# Patient Record
Sex: Male | Born: 1965 | Race: White | Hispanic: No | Marital: Single | Smoking: Former smoker
Health system: Southern US, Community
[De-identification: ages and names within clinical notes are randomized; demographics above are authoritative.]

## PROBLEM LIST (undated history)

## (undated) DIAGNOSIS — J449 Chronic obstructive pulmonary disease, unspecified: Secondary | ICD-10-CM

## (undated) DIAGNOSIS — B1920 Unspecified viral hepatitis C without hepatic coma: Secondary | ICD-10-CM

## (undated) HISTORY — PX: FINGER SURGERY: SHX640

---

## 2018-05-13 ENCOUNTER — Emergency Department (HOSPITAL_COMMUNITY)

## 2018-05-13 ENCOUNTER — Encounter (HOSPITAL_COMMUNITY): Payer: Self-pay

## 2018-05-13 ENCOUNTER — Inpatient Hospital Stay (HOSPITAL_COMMUNITY)
Admission: EM | Admit: 2018-05-13 | Discharge: 2018-05-15 | DRG: 192 | Disposition: A | Discharge status: Home / Self Care | Attending: Family Medicine | Admitting: Family Medicine

## 2018-05-13 ENCOUNTER — Other Ambulatory Visit: Payer: Self-pay

## 2018-05-13 DIAGNOSIS — B182 Chronic viral hepatitis C: Secondary | ICD-10-CM | POA: Diagnosis present

## 2018-05-13 DIAGNOSIS — Z87891 Personal history of nicotine dependence: Secondary | ICD-10-CM

## 2018-05-13 DIAGNOSIS — R740 Nonspecific elevation of levels of transaminase and lactic acid dehydrogenase [LDH]: Secondary | ICD-10-CM

## 2018-05-13 DIAGNOSIS — Z79899 Other long term (current) drug therapy: Secondary | ICD-10-CM

## 2018-05-13 DIAGNOSIS — J449 Chronic obstructive pulmonary disease, unspecified: Secondary | ICD-10-CM | POA: Diagnosis present

## 2018-05-13 DIAGNOSIS — R059 Cough, unspecified: Secondary | ICD-10-CM | POA: Diagnosis present

## 2018-05-13 DIAGNOSIS — J441 Chronic obstructive pulmonary disease with (acute) exacerbation: Secondary | ICD-10-CM | POA: Diagnosis not present

## 2018-05-13 DIAGNOSIS — R0603 Acute respiratory distress: Secondary | ICD-10-CM | POA: Diagnosis not present

## 2018-05-13 DIAGNOSIS — R05 Cough: Secondary | ICD-10-CM | POA: Diagnosis present

## 2018-05-13 DIAGNOSIS — J069 Acute upper respiratory infection, unspecified: Secondary | ICD-10-CM | POA: Diagnosis present

## 2018-05-13 DIAGNOSIS — B1920 Unspecified viral hepatitis C without hepatic coma: Secondary | ICD-10-CM | POA: Diagnosis present

## 2018-05-13 DIAGNOSIS — R7401 Elevation of levels of liver transaminase levels: Secondary | ICD-10-CM | POA: Diagnosis present

## 2018-05-13 DIAGNOSIS — D696 Thrombocytopenia, unspecified: Secondary | ICD-10-CM | POA: Diagnosis present

## 2018-05-13 DIAGNOSIS — Z23 Encounter for immunization: Secondary | ICD-10-CM

## 2018-05-13 DIAGNOSIS — R000 Tachycardia, unspecified: Secondary | ICD-10-CM | POA: Diagnosis present

## 2018-05-13 DIAGNOSIS — Z882 Allergy status to sulfonamides status: Secondary | ICD-10-CM

## 2018-05-13 HISTORY — DX: Unspecified viral hepatitis C without hepatic coma: B19.20

## 2018-05-13 HISTORY — DX: Chronic obstructive pulmonary disease, unspecified: J44.9

## 2018-05-13 LAB — COMPREHENSIVE METABOLIC PANEL
ALT: 254 U/L — AB (ref 0–44)
AST: 119 U/L — AB (ref 15–41)
Albumin: 3.8 g/dL (ref 3.5–5.0)
Alkaline Phosphatase: 64 U/L (ref 38–126)
Anion gap: 7 (ref 5–15)
BILIRUBIN TOTAL: 1 mg/dL (ref 0.3–1.2)
BUN: 9 mg/dL (ref 6–20)
CO2: 24 mmol/L (ref 22–32)
CREATININE: 0.69 mg/dL (ref 0.61–1.24)
Calcium: 8.7 mg/dL — ABNORMAL LOW (ref 8.9–10.3)
Chloride: 107 mmol/L (ref 98–111)
GFR calc Af Amer: >60 mL/min (ref >60)
Glucose, Bld: 121 mg/dL — ABNORMAL HIGH (ref 70–99)
Potassium: 3.5 mmol/L (ref 3.5–5.1)
Sodium: 138 mmol/L (ref 135–145)
TOTAL PROTEIN: 8.1 g/dL (ref 6.5–8.1)

## 2018-05-13 LAB — CBC WITH DIFFERENTIAL/PLATELET
Abs Immature Granulocytes: 0.01 10*3/uL (ref 0.00–0.07)
BASOS PCT: 1 %
Basophils Absolute: 0.1 10*3/uL (ref 0.0–0.1)
EOS ABS: 0.4 10*3/uL (ref 0.0–0.5)
Eosinophils Relative: 8 %
HCT: 45.6 % (ref 39.0–52.0)
Hemoglobin: 15.8 g/dL (ref 13.0–17.0)
Immature Granulocytes: 0 %
Lymphocytes Relative: 14 %
Lymphs Abs: 0.6 10*3/uL — ABNORMAL LOW (ref 0.7–4.0)
MCH: 32.8 pg (ref 26.0–34.0)
MCHC: 34.6 g/dL (ref 30.0–36.0)
MCV: 94.6 fL (ref 80.0–100.0)
MONO ABS: 0.2 10*3/uL (ref 0.1–1.0)
MONOS PCT: 5 %
NEUTROS PCT: 72 %
Neutro Abs: 3.4 10*3/uL (ref 1.7–7.7)
Platelets: 125 10*3/uL — ABNORMAL LOW (ref 150–400)
RBC: 4.82 MIL/uL (ref 4.22–5.81)
RDW: 12.4 % (ref 11.5–15.5)
WBC: 4.7 10*3/uL (ref 4.0–10.5)
nRBC: 0 % (ref 0.0–0.2)

## 2018-05-13 LAB — TROPONIN I: Troponin I: <0.03 ng/mL (ref <0.03)

## 2018-05-13 LAB — MRSA PCR SCREENING: MRSA by PCR: NEGATIVE

## 2018-05-13 MED ORDER — SODIUM CHLORIDE 0.9% FLUSH
3.0000 mL | Freq: Two times a day (BID) | INTRAVENOUS | Status: DC
  Administered 2018-05-13 – 2018-05-14 (×4): 3 mL via INTRAVENOUS

## 2018-05-13 MED ORDER — TRAZODONE HCL 50 MG PO TABS: 50.0000 mg | ORAL_TABLET | Freq: Every evening | ORAL | Status: DC | PRN

## 2018-05-13 MED ORDER — METHYLPREDNISOLONE SODIUM SUCC 125 MG IJ SOLR
125.0000 mg | Freq: Once | INTRAMUSCULAR | Status: DC
  Filled 2018-05-13: qty 2

## 2018-05-13 MED ORDER — ONDANSETRON HCL 4 MG PO TABS: 4.0000 mg | ORAL_TABLET | Freq: Four times a day (QID) | ORAL | Status: DC | PRN

## 2018-05-13 MED ORDER — GUAIFENESIN ER 600 MG PO TB12
1200.0000 mg | ORAL_TABLET | Freq: Two times a day (BID) | ORAL | Status: DC
  Administered 2018-05-13 – 2018-05-15 (×5): 1200 mg via ORAL
  Filled 2018-05-13 (×5): qty 2

## 2018-05-13 MED ORDER — ONDANSETRON HCL 4 MG/2ML IJ SOLN: 4.0000 mg | Freq: Four times a day (QID) | INTRAMUSCULAR | Status: DC | PRN

## 2018-05-13 MED ORDER — SODIUM CHLORIDE 0.9% FLUSH: 3.0000 mL | INTRAVENOUS | Status: DC | PRN

## 2018-05-13 MED ORDER — ENOXAPARIN SODIUM 40 MG/0.4ML ~~LOC~~ SOLN
40.0000 mg | SUBCUTANEOUS | Status: DC
  Administered 2018-05-13 – 2018-05-14 (×2): 40 mg via SUBCUTANEOUS
  Filled 2018-05-13 (×2): qty 0.4

## 2018-05-13 MED ORDER — ACETAMINOPHEN 650 MG RE SUPP: 650.0000 mg | Freq: Four times a day (QID) | RECTAL | Status: DC | PRN

## 2018-05-13 MED ORDER — SODIUM CHLORIDE 0.9 % IV SOLN
100.0000 mg | Freq: Two times a day (BID) | INTRAVENOUS | Status: DC
  Administered 2018-05-13 – 2018-05-15 (×4): 100 mg via INTRAVENOUS
  Filled 2018-05-13 (×8): qty 100

## 2018-05-13 MED ORDER — ACETAMINOPHEN 325 MG PO TABS: 650.0000 mg | ORAL_TABLET | Freq: Four times a day (QID) | ORAL | Status: DC | PRN

## 2018-05-13 MED ORDER — ALBUTEROL (5 MG/ML) CONTINUOUS INHALATION SOLN
10.0000 mg/h | INHALATION_SOLUTION | Freq: Once | RESPIRATORY_TRACT | Status: AC
  Administered 2018-05-13: 10 mg/h via RESPIRATORY_TRACT
  Filled 2018-05-13: qty 20

## 2018-05-13 MED ORDER — IPRATROPIUM-ALBUTEROL 0.5-2.5 (3) MG/3ML IN SOLN
3.0000 mL | RESPIRATORY_TRACT | Status: DC
  Administered 2018-05-13 – 2018-05-15 (×10): 3 mL via RESPIRATORY_TRACT
  Filled 2018-05-13 (×9): qty 3

## 2018-05-13 MED ORDER — SODIUM CHLORIDE 0.9 % IV SOLN: 250.0000 mL | INTRAVENOUS | Status: DC | PRN

## 2018-05-13 MED ORDER — METHYLPREDNISOLONE SODIUM SUCC 125 MG IJ SOLR
60.0000 mg | Freq: Four times a day (QID) | INTRAMUSCULAR | Status: DC
  Administered 2018-05-13 – 2018-05-14 (×4): 60 mg via INTRAVENOUS
  Filled 2018-05-13 (×4): qty 2

## 2018-05-13 MED ORDER — SENNOSIDES-DOCUSATE SODIUM 8.6-50 MG PO TABS: 1.0000 | ORAL_TABLET | Freq: Every evening | ORAL | Status: DC | PRN

## 2018-05-13 MED ORDER — IPRATROPIUM BROMIDE 0.02 % IN SOLN
1.0000 mg | Freq: Once | RESPIRATORY_TRACT | Status: AC
  Administered 2018-05-13: 1 mg via RESPIRATORY_TRACT
  Filled 2018-05-13: qty 5

## 2018-05-13 MED ORDER — DEXTROMETHORPHAN POLISTIREX ER 30 MG/5ML PO SUER
60.0000 mg | Freq: Two times a day (BID) | ORAL | Status: DC | PRN
  Administered 2018-05-13 – 2018-05-14 (×2): 60 mg via ORAL
  Filled 2018-05-13 (×2): qty 10

## 2018-05-13 MED ORDER — INFLUENZA VAC SPLIT QUAD 0.5 ML IM SUSY
0.5000 mL | PREFILLED_SYRINGE | INTRAMUSCULAR | Status: AC
  Administered 2018-05-14: 0.5 mL via INTRAMUSCULAR
  Filled 2018-05-13: qty 0.5

## 2018-05-13 NOTE — ED Triage Notes (Signed)
[facility].  C/O sob and chest pain x 2 weeks.  Reports Nurse administered keflex, 2 albuterol  Nebs, and prednisone this morning.  EMS gave 125mg  solumedrol IV and 2 more neb treatments.  PT reports cp relieved.

## 2018-05-13 NOTE — H&P (Signed)
History and Physical  Aleksi Brummet ZOX:[id number] DOB: [birth year] DOA: 05/13/2018  Referring physician: Clarene Duke  PCP: Patient, No Pcp Per   Chief Complaint: shortness of breath   HPI: Fred Stewart is a 52 y.o. male former smoker with advanced COPD who is currently incarcerated presented to ED with complaints of worsening shortness of breath wheezing and coughing.  He had been started on an outpatient treatment with oral prednisone and antibiotics but reports that his symptoms continue to progress in the wheezing coughing and shortness of breath and chest discomfort.  His symptoms have been progressive over the last 2 weeks.  EMS administered 125 mg of IV Solu-Medrol and nebulizer treatments.  Patient was seen in the emergency department and given a 1 hour nebulizer treatment and continued to have wheezing and coughing and desaturation.  He is complaining of persistent cough and shortness of breath.  He is going to be admitted for further observation and treatment.  He has a history of HCV and noted to have elevated transaminase levels.     Review of Systems: All systems reviewed and apart from history of presenting illness, are negative.  Past Medical History:  Diagnosis Date  . COPD (chronic obstructive pulmonary disease) (HCC)   . Hepatitis C    Past Surgical History:  Procedure Laterality Date  . FINGER SURGERY     Social History:  reports that he has quit smoking. He has never used smokeless tobacco. He reports that he drank alcohol. He reports that he does not use drugs.  Allergies  Allergen Reactions  . Sulfur Anaphylaxis and Rash    History reviewed. No pertinent family history.  Prior to Admission medications   Medication Sig Start Date End Date Taking? Authorizing Provider  albuterol (PROVENTIL HFA;VENTOLIN HFA) 108 (90 Base) MCG/ACT inhaler Inhale 1-2 puffs into the lungs every 6 (six) hours as needed for wheezing or shortness of breath.   Yes [provider]     Physical Exam: Vitals:   05/13/18 1200 05/13/18 1230 05/13/18 1300 05/13/18 1330  BP: 124/68 112/68 109/67 106/65  Pulse: (!) 105 (!) 114 (!) 119 (!) 130  Resp: 20 (!) 22 (!) 24 (!) 25  Temp:      TempSrc:      SpO2: 94% 96% 96% 95%  Weight:      Height:        General exam: Moderately built and nourished patient, lying comfortably supine on the gurney in no obvious distress.  Head, eyes and ENT: Nontraumatic and normocephalic. Pupils equally reacting to light and accommodation. Oral mucosa moist.  Neck: Supple. No JVD, carotid bruit or thyromegaly.  Lymphatics: No lymphadenopathy.  Respiratory system: diffuse inspiratory and fine expiratory wheezes heard bilateral.  Cardiovascular system: S1 and S2 heard, tachycardic. No JVD, murmurs, gallops, clicks or pedal edema.  Gastrointestinal system: Abdomen is nondistended, soft and nontender. Normal bowel sounds heard. No organomegaly or masses appreciated.  Central nervous system: Alert and oriented. No focal neurological deficits.  Extremities: Symmetric 5 x 5 power. Peripheral pulses symmetrically felt.   Skin: No rashes or acute findings.  Musculoskeletal system: Negative exam.  Psychiatry: Pleasant and cooperative.  Labs on Admission:  Basic Metabolic Panel: Recent Labs  Lab 05/13/18 1102  NA 138  K 3.5  CL 107  CO2 24  GLUCOSE 121*  BUN 9  CREATININE 0.69  CALCIUM 8.7*   Liver Function Tests: Recent Labs  Lab 05/13/18 1102  AST 119*  ALT 254*  ALKPHOS 64  BILITOT 1.0  PROT 8.1  ALBUMIN 3.8   No results for input(s): LIPASE, AMYLASE in the last 168 hours. No results for input(s): AMMONIA in the last 168 hours. CBC: Recent Labs  Lab 05/13/18 1102  WBC 4.7  NEUTROABS 3.4  HGB 15.8  HCT 45.6  MCV 94.6  PLT 125*   Cardiac Enzymes: Recent Labs  Lab 05/13/18 1102  TROPONINI <0.03    BNP (last 3 results) No results for input(s): PROBNP in the last 8760 hours. CBG: No results for  input(s): GLUCAP in the last 168 hours.  Radiological Exams on Admission: Dg Chest 2 View  Result Date: 05/13/2018 CLINICAL DATA:  Shortness of breath and chest pain over the last 2 weeks. Asthma. EXAM: CHEST - 2 VIEW COMPARISON:  None. FINDINGS: Artifact overlies the chest. Heart size is normal. Mediastinal shadows are normal. Lung volumes are at the upper limits of normal. There may be mild central bronchial thickening but there is no infiltrate, collapse or effusion. No bone abnormality. IMPRESSION: Question mild air trapping. Borderline central bronchial thickening. No consolidation or collapse. Electronically Signed   By: Paulina FusiMark  Shogry M.D.   On: 05/13/2018 11:38   EKG: Independently reviewed.  Sinus tachycardia  Assessment/Plan Principal Problem:   COPD with acute exacerbation (HCC) Active Problems:   Acute respiratory distress   URI (upper respiratory infection)   Hepatitis C   COPD (chronic obstructive pulmonary disease) (HCC)   Thrombocytopenia (HCC)   Cough   Elevated transaminase level  1. Acute COPD exacerbation-failed outpatient management and continues to have respiratory distress will admit for observation continue IV steroids and nebulizer treatments.  Continue respiratory support with oxygen as needed.  Continue antibiotic therapy. 2. Chest discomfort - EKG with no acute findings, symptoms related to acute COPD exacerbation, following troponin for completeness.  3. Chronic hepatitis C- patient is being treated in the outpatient setting for this. 4. Elevated transaminases-secondary to chronic hepatitis C. 5. URI-likely precipitated the COPD exacerbation-treating with antibiotics. 6. Thrombocytopenia-likely reactive from acute illness-no signs or symptoms of bleeding at this time. 7. Sinus tachycardia - secondary to recent continuous albuterol treatments.   DVT Prophylaxis: SCDs Code Status: full   Family Communication: patient updated   Disposition Plan: DC tomorrow if  improving   Time spent: 58  minutes  Standley Dakinslanford Johnson, MD [facility] Pager [phone]  If 7PM-7AM, please contact night-coverage [email or web address] Password [email or web address] 05/13/2018, 2:36 PM

## 2018-05-13 NOTE — ED Notes (Signed)
Patient ambulated in hallway.  O2 sat was 92-94% walking.

## 2018-05-13 NOTE — ED Provider Notes (Signed)
[facility] EMERGENCY DEPARTMENT Provider Note   CSN: [id number] Arrival date & time: 05/13/18  1027     History   Chief Complaint Chief Complaint  Patient presents with  . Shortness of Breath    HPI Fred Stewart is a 52 y.o. male.  HPI  Pt was seen at 1045.  Per pt, c/o gradual onset and worsening of persistent cough, wheezing and SOB for the past 2 weeks, worse over the past 3 to 4 days.  Describes his symptoms as "my COPD."  Has been using home MDI without relief. Pt states he was also started on prednisone and an antibiotic 4 days ago by [facility]. Has been associated with constant generalized chest "pain" for the past 2 weeks. EMS gave IV solumedrol and nebs x2 en route. Denies palpitations, no back pain, no abd pain, no N/V/D, no fevers, no rash.    Past Medical History:  Diagnosis Date  . COPD (chronic obstructive pulmonary disease) (HCC)   . Hepatitis C     There are no active problems to display for this patient.   Past Surgical History:  Procedure Laterality Date  . FINGER SURGERY          Home Medications    Prior to Admission medications   Not on File    Family History No family history on file.  Social History Social History   Tobacco Use  . Smoking status: Former Games developer  . Smokeless tobacco: Never Used  Substance Use Topics  . Alcohol use: Not Currently  . Drug use: Never     Allergies   Patient has no known allergies.   Review of Systems Review of Systems ROS: Statement: All systems negative except as marked or noted in the HPI; Constitutional: Negative for fever and chills. ; ; Eyes: Negative for eye pain, redness and discharge. ; ; ENMT: Negative for ear pain, hoarseness, nasal congestion, sinus pressure and sore throat. ; ; Cardiovascular: Negative for chest pain, palpitations, diaphoresis, and peripheral edema. ; ; Respiratory: +cough, wheezing, SOB. Negative for stridor. ; ; Gastrointestinal:  Negative for nausea, vomiting, diarrhea, abdominal pain, blood in stool, hematemesis, jaundice and rectal bleeding. . ; ; Genitourinary: Negative for dysuria, flank pain and hematuria. ; ; Musculoskeletal: Negative for back pain and neck pain. Negative for swelling and trauma.; ; Skin: Negative for pruritus, rash, abrasions, blisters, bruising and skin lesion.; ; Neuro: Negative for headache, lightheadedness and neck stiffness. Negative for weakness, altered level of consciousness, altered mental status, extremity weakness, paresthesias, involuntary movement, seizure and syncope.       Physical Exam Updated Vital Signs BP 116/73 (BP Location: Right Arm)   Pulse (!) 118   Temp 98.5 F (36.9 C) (Oral)   Resp 20   Ht 6' (1.829 m)   Wt 75.8 kg   SpO2 100%   BMI 22.65 kg/m    Patient Vitals for the past 24 hrs:  BP Temp Temp src Pulse Resp SpO2 Height Weight  05/13/18 1330 106/65 - - (!) 130 (!) 25 95 % - -  05/13/18 1300 109/67 - - (!) 119 (!) 24 96 % - -  05/13/18 1230 112/68 - - (!) 114 (!) 22 96 % - -  05/13/18 1200 124/68 - - (!) 105 20 94 % - -  05/13/18 1144 - - - - - 90 % - -  05/13/18 1130 115/81 - - (!) 114 19 92 % - -  05/13/18 1031 - - - - - -  6' (1.829 m) 75.8 kg  05/13/18 1030 116/73 98.5 F (36.9 C) Oral (!) 118 20 100 % - -     Physical Exam 1050: Physical examination:  Nursing notes reviewed; Vital signs and O2 SAT reviewed;  Constitutional: Well developed, Well nourished, Well hydrated, Uncomfortable appearing.;; Head:  Normocephalic, atraumatic; Eyes: EOMI, PERRL, No scleral icterus; ENMT: Mouth and pharynx normal, Mucous membranes moist; Neck: Supple, Full range of motion, No lymphadenopathy; Cardiovascular: Tachycardic rate and rhythm, No gallop; Respiratory: Breath sounds coarse & equal bilaterally, insp/exp wheezes bilat. Faint audible wheezing.  Speaking short sentences, sitting upright, tachypneic.; Chest: Nontender, Movement normal; Abdomen: Soft, Nontender,  Nondistended, Normal bowel sounds; Genitourinary: No CVA tenderness; Extremities: Pulses normal, No tenderness, No edema, No calf edema or asymmetry.; Neuro: AA&Ox3, Major CN grossly intact.  Speech clear. No gross focal motor or sensory deficits in extremities.; Skin: Color normal, Warm, Dry.   ED Treatments / Results  Labs (all labs ordered are listed, but only abnormal results are displayed)   EKG None  Radiology   Procedures Procedures (including critical care time)  Medications Ordered in ED Medications  methylPREDNISolone sodium succinate (SOLU-MEDROL) 125 mg/2 mL injection 125 mg (has no administration in time range)  albuterol (PROVENTIL,VENTOLIN) solution continuous neb (has no administration in time range)  ipratropium (ATROVENT) nebulizer solution 1 mg (has no administration in time range)     Initial Impression / Assessment and Plan / ED Course  I have reviewed the triage vital signs and the nursing notes.  Pertinent labs & imaging results that were available during my care of the patient were reviewed by me and considered in my medical decision making (see chart for details).  MDM Reviewed: previous chart, nursing note and vitals Reviewed previous: labs and ECG Interpretation: labs, ECG and x-ray Total time providing critical care: 30-74 minutes. This excludes time spent performing separately reportable procedures and services.   CRITICAL CARE Performed by: Samuel JesterKathleen Kynzlee Hucker Total critical care time: 35 minutes Critical care time was exclusive of separately billable procedures and treating other patients. Critical care was necessary to treat or prevent imminent or life-threatening deterioration. Critical care was time spent personally by me on the following activities: development of treatment plan with patient and/or surrogate as well as nursing, discussions with consultants, evaluation of patient's response to treatment, examination of patient, obtaining  history from patient or surrogate, ordering and performing treatments and interventions, ordering and review of laboratory studies, ordering and review of radiographic studies, pulse oximetry and re-evaluation of patient's condition.   Results for orders placed or performed during the hospital encounter of 05/13/18  CBC with Differential  Result Value Ref Range   WBC 4.7 4.0 - 10.5 K/uL   RBC 4.82 4.22 - 5.81 MIL/uL   Hemoglobin 15.8 13.0 - 17.0 g/dL   HCT 16.145.6 09.639.0 - 04.552.0 %   MCV 94.6 80.0 - 100.0 fL   MCH 32.8 26.0 - 34.0 pg   MCHC 34.6 30.0 - 36.0 g/dL   RDW 40.912.4 81.111.5 - 91.415.5 %   Platelets 125 (L) 150 - 400 K/uL   nRBC 0.0 0.0 - 0.2 %   Neutrophils Relative % 72 %   Neutro Abs 3.4 1.7 - 7.7 K/uL   Lymphocytes Relative 14 %   Lymphs Abs 0.6 (L) 0.7 - 4.0 K/uL   Monocytes Relative 5 %   Monocytes Absolute 0.2 0.1 - 1.0 K/uL   Eosinophils Relative 8 %   Eosinophils Absolute 0.4 0.0 - 0.5 K/uL  Basophils Relative 1 %   Basophils Absolute 0.1 0.0 - 0.1 K/uL   Immature Granulocytes 0 %   Abs Immature Granulocytes 0.01 0.00 - 0.07 K/uL  Comprehensive metabolic panel  Result Value Ref Range   Sodium 138 135 - 145 mmol/L   Potassium 3.5 3.5 - 5.1 mmol/L   Chloride 107 98 - 111 mmol/L   CO2 24 22 - 32 mmol/L   Glucose, Bld 121 (H) 70 - 99 mg/dL   BUN 9 6 - 20 mg/dL   Creatinine, Ser 7.84 0.61 - 1.24 mg/dL   Calcium 8.7 (L) 8.9 - 10.3 mg/dL   Total Protein 8.1 6.5 - 8.1 g/dL   Albumin 3.8 3.5 - 5.0 g/dL   AST 696 (H) 15 - 41 U/L   ALT 254 (H) 0 - 44 U/L   Alkaline Phosphatase 64 38 - 126 U/L   Total Bilirubin 1.0 0.3 - 1.2 mg/dL   GFR calc non Af Amer >60 >60 mL/min   GFR calc Af Amer >60 >60 mL/min   Anion gap 7 5 - 15  Troponin I - ONCE - STAT  Result Value Ref Range   Troponin I <0.03 <0.03 ng/mL   Dg Chest 2 View Result Date: 05/13/2018 CLINICAL DATA:  Shortness of breath and chest pain over the last 2 weeks. Asthma. EXAM: CHEST - 2 VIEW COMPARISON:  None. FINDINGS:  Artifact overlies the chest. Heart size is normal. Mediastinal shadows are normal. Lung volumes are at the upper limits of normal. There may be mild central bronchial thickening but there is no infiltrate, collapse or effusion. No bone abnormality. IMPRESSION: Question mild air trapping. Borderline central bronchial thickening. No consolidation or collapse. Electronically Signed   By: Paulina Fusi M.D.   On: 05/13/2018 11:38     1430:  On arrival: pt sitting upright, tachypneic, tachycardic, Sats 90 % R/A, lungs coarse with insp/exp wheezes bilat with audible wheezing. IV solumedrol already given by EMS. Hour long neb started. After neb: pt appears more comfortable at rest, less tachypneic, Sats 94-96 % on O2 2L N/C, lungs continue coarse with wheezing. Pt ambulated in hallway: pt's O2 Sats dropped to 92-94 % R/A with pt c/o increasing SOB, with increasing HR and RR. Pt escorted back to stretcher with O2 Sats slowly increasing to 95-96 % on O2 2L N/C.  T/C returned from [facility] Laural Benes, case discussed, including:  HPI, pertinent PM/SHx, VS/PE, dx testing, ED course and treatment:  Agreeable to admit.      Final Clinical Impressions(s) / ED Diagnoses   Final diagnoses:  None    ED Discharge Orders    None       Samuel Jester, DO 05/17/18 2140

## 2018-05-13 NOTE — ED Notes (Signed)
Patient transported to X-ray 

## 2018-05-14 ENCOUNTER — Observation Stay (HOSPITAL_COMMUNITY)

## 2018-05-14 DIAGNOSIS — J069 Acute upper respiratory infection, unspecified: Secondary | ICD-10-CM

## 2018-05-14 DIAGNOSIS — J441 Chronic obstructive pulmonary disease with (acute) exacerbation: Principal | ICD-10-CM

## 2018-05-14 DIAGNOSIS — Z79899 Other long term (current) drug therapy: Secondary | ICD-10-CM | POA: Diagnosis not present

## 2018-05-14 DIAGNOSIS — B182 Chronic viral hepatitis C: Secondary | ICD-10-CM | POA: Diagnosis present

## 2018-05-14 DIAGNOSIS — R740 Nonspecific elevation of levels of transaminase and lactic acid dehydrogenase [LDH]: Secondary | ICD-10-CM

## 2018-05-14 DIAGNOSIS — J449 Chronic obstructive pulmonary disease, unspecified: Secondary | ICD-10-CM

## 2018-05-14 DIAGNOSIS — Z23 Encounter for immunization: Secondary | ICD-10-CM | POA: Diagnosis not present

## 2018-05-14 DIAGNOSIS — D696 Thrombocytopenia, unspecified: Secondary | ICD-10-CM

## 2018-05-14 DIAGNOSIS — R05 Cough: Secondary | ICD-10-CM | POA: Diagnosis not present

## 2018-05-14 DIAGNOSIS — R0603 Acute respiratory distress: Secondary | ICD-10-CM | POA: Diagnosis present

## 2018-05-14 DIAGNOSIS — Z882 Allergy status to sulfonamides status: Secondary | ICD-10-CM | POA: Diagnosis not present

## 2018-05-14 DIAGNOSIS — Z87891 Personal history of nicotine dependence: Secondary | ICD-10-CM | POA: Diagnosis not present

## 2018-05-14 DIAGNOSIS — R000 Tachycardia, unspecified: Secondary | ICD-10-CM | POA: Diagnosis present

## 2018-05-14 LAB — CBC
HCT: 40.2 % (ref 39.0–52.0)
HEMOGLOBIN: 14.2 g/dL (ref 13.0–17.0)
MCH: 33 pg (ref 26.0–34.0)
MCHC: 35.3 g/dL (ref 30.0–36.0)
MCV: 93.5 fL (ref 80.0–100.0)
NRBC: 0 % (ref 0.0–0.2)
Platelets: 136 10*3/uL — ABNORMAL LOW (ref 150–400)
RBC: 4.3 MIL/uL (ref 4.22–5.81)
RDW: 12.3 % (ref 11.5–15.5)
WBC: 8.9 10*3/uL (ref 4.0–10.5)

## 2018-05-14 LAB — HIV ANTIBODY (ROUTINE TESTING W REFLEX): HIV SCREEN 4TH GENERATION: NONREACTIVE

## 2018-05-14 MED ORDER — DIPHENOXYLATE-ATROPINE 2.5-0.025 MG PO TABS: 1.0000 | ORAL_TABLET | Freq: Four times a day (QID) | ORAL | Status: DC | PRN

## 2018-05-14 MED ORDER — LORAZEPAM 0.5 MG PO TABS: 0.5000 mg | ORAL_TABLET | Freq: Three times a day (TID) | ORAL | Status: DC | PRN

## 2018-05-14 MED ORDER — METHYLPREDNISOLONE SODIUM SUCC 125 MG IJ SOLR
80.0000 mg | Freq: Four times a day (QID) | INTRAMUSCULAR | Status: DC
  Administered 2018-05-14 – 2018-05-15 (×3): 80 mg via INTRAVENOUS
  Filled 2018-05-14 (×3): qty 2

## 2018-05-14 MED ORDER — SACCHAROMYCES BOULARDII 250 MG PO CAPS
250.0000 mg | ORAL_CAPSULE | Freq: Two times a day (BID) | ORAL | Status: DC
  Administered 2018-05-14 – 2018-05-15 (×3): 250 mg via ORAL
  Filled 2018-05-14 (×2): qty 1

## 2018-05-14 NOTE — Progress Notes (Signed)
PROGRESS NOTE    Fred Stewart  ZOX:[id number]  DOB: [birth year]  DOA: 05/13/2018 PCP: Patient, No Pcp Per  Brief Admission Hx: 52 y.o. male former smoker with advanced COPD who is currently incarcerated presented to ED with complaints of worsening shortness of breath wheezing and coughing.  He had been started on an outpatient treatment with oral prednisone and antibiotics but reports that his symptoms continue to progress in the wheezing coughing and shortness of breath and chest discomfort.  MDM/Assessment & Plan:   1. Acute COPD exacerbation-failed outpatient management and continues to have respiratory distress will admit for further treatment,  continue IV steroids and nebulizer treatments.  He is still wheezing badly, coughing and very SOB with ambulation.  He needs more inpatient treatment.  Continue respiratory support with oxygen as needed.  Continue antibiotic therapy. 2. Chest discomfort - EKG with no acute findings, symptoms related to acute COPD exacerbation, troponin negative x 3.  3. Chronic hepatitis C- patient is being treated in the outpatient setting for this. 4. Elevated transaminases-secondary to chronic hepatitis C. 5. URI-likely precipitated the COPD exacerbation-treating with antibiotics. 6. Thrombocytopenia-likely reactive from acute illness-no signs or symptoms of bleeding at this time. 7. Sinus tachycardia - secondary to recent continuous albuterol treatments.   DVT Prophylaxis: SCDs Code Status: full   Family Communication: patient updated   Disposition Plan: DC when medically stabilized    Antimicrobials:  Doxycycline 11/14   Subjective: Pt continues to cough and wheeze and very SOB with minimal ambulation.    Objective: Vitals:   05/14/18 0259 05/14/18 0522 05/14/18 0758 05/14/18 1148  BP:  111/74    Pulse:  (!) 106    Resp:  20    Temp:  98.4 F (36.9 C)    TempSrc:  Oral    SpO2: 98% 95% 92% 93%  Weight:      Height:         Intake/Output Summary (Last 24 hours) at 05/14/2018 1444 Last data filed at 05/14/2018 1000 Gross per 24 hour  Intake 875 ml  Output -  Net 875 ml   Filed Weights   05/13/18 1031  Weight: 75.8 kg   REVIEW OF SYSTEMS  As per history otherwise all reviewed and reported negative  Exam:  General exam: sitting up in bed. Cooperative.  Respiratory system: diffuse bilateral expiratory wheezes. No increased work of breathing. Cardiovascular system: S1 & S2 heard, tachycardic.  No JVD, murmurs, gallops, clicks or pedal edema. Gastrointestinal system: Abdomen is nondistended, soft and nontender. Normal bowel sounds heard. Central nervous system: Alert and oriented. No focal neurological deficits. Extremities: no CCE.  Data Reviewed: Basic Metabolic Panel: Recent Labs  Lab 05/13/18 1102  NA 138  K 3.5  CL 107  CO2 24  GLUCOSE 121*  BUN 9  CREATININE 0.69  CALCIUM 8.7*   Liver Function Tests: Recent Labs  Lab 05/13/18 1102  AST 119*  ALT 254*  ALKPHOS 64  BILITOT 1.0  PROT 8.1  ALBUMIN 3.8   No results for input(s): LIPASE, AMYLASE in the last 168 hours. No results for input(s): AMMONIA in the last 168 hours. CBC: Recent Labs  Lab 05/13/18 1102 05/14/18 0623  WBC 4.7 8.9  NEUTROABS 3.4  --   HGB 15.8 14.2  HCT 45.6 40.2  MCV 94.6 93.5  PLT 125* 136*   Cardiac Enzymes: Recent Labs  Lab 05/13/18 1102 05/13/18 1524 05/13/18 2052  TROPONINI <0.03 <0.03 <0.03   CBG (last 3)  No results for  input(s): GLUCAP in the last 72 hours. Recent Results (from the past 240 hour(s))  MRSA PCR Screening     Status: None   Collection Time: 05/13/18  4:23 PM  Result Value Ref Range Status   MRSA by PCR NEGATIVE NEGATIVE Final    Comment:        The GeneXpert MRSA Assay (FDA approved for NASAL specimens only), is one component of a comprehensive MRSA colonization surveillance program. It is not intended to diagnose MRSA infection nor to guide or monitor  treatment for MRSA infections. Performed at [facility], [address] [place], [place] [address]      Studies: X-ray Chest Pa And Lateral  Result Date: 05/14/2018 CLINICAL DATA:  Shortness of breath and chest pain for the past 2 weeks associated with productive cough. History of COPD, discontinued smoking 2 years ago. EXAM: CHEST - 2 VIEW COMPARISON:  PA and lateral chest x-ray of May 13, 2018 FINDINGS: The lungs are well-expanded. There is no focal infiltrate. There is no pleural effusion or pneumothorax. The heart is normal in size. The perihilar lung markings are mildly increased as compared to the previous study. No abnormal nodules are observed. The bony thorax exhibits no acute abnormality. IMPRESSION: Chronic bronchitic changes. I cannot exclude superimposed acute bronchitis. No alveolar pneumonia nor CHF. Electronically Signed   By: David  SwazilandJordan M.D.   On: 05/14/2018 09:56   Dg Chest 2 View  Result Date: 05/13/2018 CLINICAL DATA:  Shortness of breath and chest pain over the last 2 weeks. Asthma. EXAM: CHEST - 2 VIEW COMPARISON:  None. FINDINGS: Artifact overlies the chest. Heart size is normal. Mediastinal shadows are normal. Lung volumes are at the upper limits of normal. There may be mild central bronchial thickening but there is no infiltrate, collapse or effusion. No bone abnormality. IMPRESSION: Question mild air trapping. Borderline central bronchial thickening. No consolidation or collapse. Electronically Signed   By: Paulina FusiMark  Shogry M.D.   On: 05/13/2018 11:38   Scheduled Meds: . enoxaparin (LOVENOX) injection  40 mg Subcutaneous Q24H  . guaiFENesin  1,200 mg Oral BID  . ipratropium-albuterol  3 mL Nebulization Q4H  . methylPREDNISolone (SOLU-MEDROL) injection  60 mg Intravenous Q6H  . saccharomyces boulardii  250 mg Oral BID  . sodium chloride flush  3 mL Intravenous Q12H   Continuous Infusions: . sodium chloride    . doxycycline (VIBRAMYCIN) IV 100 mg (05/14/18  0215)    Principal Problem:   COPD with acute exacerbation (HCC) Active Problems:   Acute respiratory distress   URI (upper respiratory infection)   Hepatitis C   COPD (chronic obstructive pulmonary disease) (HCC)   Thrombocytopenia (HCC)   Cough   Elevated transaminase level  Time spent:   Standley Dakinslanford , MD, FAAFP [facility] Pager [phone]  If 7PM-7AM, please contact night-coverage [email or web address] Password [email or web address] 05/14/2018, 2:44 PM    LOS: 0 days

## 2018-05-15 DIAGNOSIS — R05 Cough: Secondary | ICD-10-CM

## 2018-05-15 MED ORDER — PREDNISONE 20 MG PO TABS: 40.0000 mg | ORAL_TABLET | Freq: Every day | ORAL | 0 refills | Status: AC

## 2018-05-15 MED ORDER — DOXYCYCLINE HYCLATE 100 MG PO CAPS: 100.0000 mg | ORAL_CAPSULE | Freq: Two times a day (BID) | ORAL | 0 refills | Status: AC

## 2018-05-15 MED ORDER — GUAIFENESIN ER 600 MG PO TB12: 1200.0000 mg | ORAL_TABLET | Freq: Two times a day (BID) | ORAL | 0 refills | Status: AC

## 2018-05-15 MED ORDER — ALBUTEROL SULFATE HFA 108 (90 BASE) MCG/ACT IN AERS: 1.0000 | INHALATION_SPRAY | RESPIRATORY_TRACT | 0 refills | Status: AC | PRN

## 2018-05-15 NOTE — Progress Notes (Signed)
IV removed, WNL. D/C instructions given to pt and officer at bedside. Pt handcuffed and escorted out via security.

## 2018-05-15 NOTE — Discharge Instructions (Signed)
Seek medical care or return to ER if symptoms come back, worsen or new problems develop.   Chronic Obstructive Pulmonary Disease Exacerbation Chronic obstructive pulmonary disease (COPD) is a common lung problem. In COPD, the flow of air from the lungs is limited. COPD exacerbations are times that breathing gets worse and you need extra treatment. Without treatment they can be life threatening. If they happen often, your lungs can become more damaged. If your COPD gets worse, your doctor may treat you with:  Medicines.  Oxygen.  Different ways to clear your airway, such as using a mask.  Follow these instructions at home:  Do not smoke.  Avoid tobacco smoke and other things that bother your lungs.  If given, take your antibiotic medicine as told. Finish the medicine even if you start to feel better.  Only take medicines as told by your doctor.  Drink enough fluids to keep your pee (urine) clear or pale yellow (unless your doctor has told you not to).  Use a cool mist machine (vaporizer).  If you use oxygen or a machine that turns liquid medicine into a mist (nebulizer), continue to use them as told.  Keep up with shots (vaccinations) as told by your doctor.  Exercise regularly.  Eat healthy foods.  Keep all doctor visits as told. Get help right away if:  You are very short of breath and it gets worse.  You have trouble talking.  You have bad chest pain.  You have blood in your spit (sputum).  You have a fever.  You keep throwing up (vomiting).  You feel weak, or you pass out (faint).  You feel confused.  You keep getting worse. This information is not intended to replace advice given to you by your health care provider. Make sure you discuss any questions you have with your health care provider. Document Released: 06/05/2011 Document Revised: 11/22/2015 Document Reviewed: 02/18/2013 Elsevier Interactive Patient Education  2017 [employer]

## 2018-05-15 NOTE — Discharge Summary (Signed)
Physician Discharge Summary  Fred Stewart JWJ:[id number] DOB: [birth year] DOA: 05/13/2018  PCP: Patient, No Pcp Per  Admit date: 05/13/2018 Discharge date: 05/15/2018  Recommendations for Outpatient Follow-up:  1. Follow up with PCP in 5-7 days  Discharge Condition: STABLE   CODE STATUS: FULL    Brief Hospitalization Summary: Please see all hospital notes, images, labs for full details of the hospitalization. HPI: Fred Stewart is a 52 y.o. male former smoker with advanced COPD who is currently incarcerated presented to ED with complaints of worsening shortness of breath wheezing and coughing.  He had been started on an outpatient treatment with oral prednisone and antibiotics but reports that his symptoms continue to progress in the wheezing coughing and shortness of breath and chest discomfort.  His symptoms have been progressive over the last 2 weeks.  EMS administered 125 mg of IV Solu-Medrol and nebulizer treatments.  Patient was seen in the emergency department and given a 1 hour nebulizer treatment and continued to have wheezing and coughing and desaturation.  He is complaining of persistent cough and shortness of breath.  He is going to be admitted for further observation and treatment.  He has a history of HCV and noted to have elevated transaminase levels.   Brief Admission Hx: 52 y.o.maleformer smoker with advanced COPD who is currently incarcerated presented to ED with complaints ofworsening shortness of breath wheezing and coughing. He had been started on an outpatient treatment with oral prednisone and antibiotics but reports that his symptoms continue to progress in the wheezing coughing and shortness of breath and chest discomfort.  MDM/Assessment & Plan:   1. Acute COPD exacerbation-failed outpatient management and was admitted for further treatment with IV steroids, antibiotics and nebulizer treatments. He is much improved now and ambulating without SOB.  Discharge on  prednisone 40 mg daily x 7 days, albuterol HFA, doxycycline x 5 more days. 2. Chest discomfort - RESOLVED. EKG with no acute findings, symptoms related to acute COPD exacerbation, troponin negative x 3. 3. Chronic hepatitis C- patient is being treated in the outpatient setting for this. 4. Elevated transaminases-secondary to chronic hepatitis C. 5. URI-likely precipitated the COPD exacerbation-treating with antibiotics. 6. Thrombocytopenia-likely reactive from acute illness-no signs or symptoms of bleeding at this time. 7. Sinus tachycardia - RESOLVED.  secondary to recent continuous albuterol treatments.  DVT Prophylaxis:SCDs Code Status:full Family Communication:patient updated Disposition Plan:DC  Antimicrobials:  Doxycycline 11/14   Discharge Diagnoses:  Principal Problem:   COPD with acute exacerbation (HCC) Active Problems:   Acute respiratory distress   URI (upper respiratory infection)   Hepatitis C   COPD (chronic obstructive pulmonary disease) (HCC)   Thrombocytopenia (HCC)   Cough   Elevated transaminase level  Discharge Instructions: Discharge Instructions    Call MD for:  difficulty breathing, headache or visual disturbances   Complete by:  As directed    Call MD for:  persistant dizziness or light-headedness   Complete by:  As directed    Increase activity slowly   Complete by:  As directed      Allergies as of 05/15/2018      Reactions   Sulfur Anaphylaxis, Rash      Medication List    TAKE these medications   albuterol 108 (90 Base) MCG/ACT inhaler Commonly known as:  PROVENTIL HFA;VENTOLIN HFA Inhale 1-2 puffs into the lungs every 2 (two) hours as needed for wheezing or shortness of breath. What changed:  when to take this   doxycycline 100 MG capsule Commonly  known as:  VIBRAMYCIN Take 1 capsule (100 mg total) by mouth 2 (two) times daily for 5 days.   guaiFENesin 600 MG 12 hr tablet Commonly known as:  MUCINEX Take 2 tablets  (1,200 mg total) by mouth 2 (two) times daily for 5 days.   predniSONE 20 MG tablet Commonly known as:  DELTASONE Take 2 tablets (40 mg total) by mouth daily with breakfast for 7 days. Start taking on:  05/16/2018      Follow-up Information    Primary care provider Follow up in 5 day(s).          Allergies  Allergen Reactions  . Sulfur Anaphylaxis and Rash   Allergies as of 05/15/2018      Reactions   Sulfur Anaphylaxis, Rash      Medication List    TAKE these medications   albuterol 108 (90 Base) MCG/ACT inhaler Commonly known as:  PROVENTIL HFA;VENTOLIN HFA Inhale 1-2 puffs into the lungs every 2 (two) hours as needed for wheezing or shortness of breath. What changed:  when to take this   doxycycline 100 MG capsule Commonly known as:  VIBRAMYCIN Take 1 capsule (100 mg total) by mouth 2 (two) times daily for 5 days.   guaiFENesin 600 MG 12 hr tablet Commonly known as:  MUCINEX Take 2 tablets (1,200 mg total) by mouth 2 (two) times daily for 5 days.   predniSONE 20 MG tablet Commonly known as:  DELTASONE Take 2 tablets (40 mg total) by mouth daily with breakfast for 7 days. Start taking on:  05/16/2018       Procedures/Studies: X-ray Chest Pa And Lateral  Result Date: 05/14/2018 CLINICAL DATA:  Shortness of breath and chest pain for the past 2 weeks associated with productive cough. History of COPD, discontinued smoking 2 years ago. EXAM: CHEST - 2 VIEW COMPARISON:  PA and lateral chest x-ray of May 13, 2018 FINDINGS: The lungs are well-expanded. There is no focal infiltrate. There is no pleural effusion or pneumothorax. The heart is normal in size. The perihilar lung markings are mildly increased as compared to the previous study. No abnormal nodules are observed. The bony thorax exhibits no acute abnormality. IMPRESSION: Chronic bronchitic changes. I cannot exclude superimposed acute bronchitis. No alveolar pneumonia nor CHF. Electronically Signed   By:  David  Swazil[place]Jordan M.D.   On: 05/14/2018 09:56   Dg Chest 2 View  Result Date: 05/13/2018 CLINICAL DATA:  Shortness of breath and chest pain over the last 2 weeks. Asthma. EXAM: CHEST - 2 VIEW COMPARISON:  None. FINDINGS: Artifact overlies the chest. Heart size is normal. Mediastinal shadows are normal. Lung volumes are at the upper limits of normal. There may be mild central bronchial thickening but there is no infiltrate, collapse or effusion. No bone abnormality. IMPRESSION: Question mild air trapping. Borderline central bronchial thickening. No consolidation or collapse. Electronically Signed   By: Paulina FusiMark  Shogry M.D.   On: 05/13/2018 11:38      Subjective: Pt says he is much better today.  He is ambulating well without SOB.  His cough is much improved.   Discharge Exam: Vitals:   05/15/18 0607 05/15/18 0808  BP: 117/80   Pulse: 96   Resp: 20   Temp: 98.4 F (36.9 C)   SpO2: 93% 94%   Vitals:   05/15/18 0100 05/15/18 0319 05/15/18 0607 05/15/18 0808  BP: 114/77  117/80   Pulse: (!) 107  96   Resp: (!) 21  20   Temp:  98.4 F (36.9 C)  98.4 F (36.9 C)   TempSrc: Oral  Oral   SpO2: 92% 98% 93% 94%  Weight:      Height:       General: Pt is alert, awake, not in acute distress Cardiovascular: RRR, S1/S2 +, no rubs, no gallops Respiratory: CTA bilaterally, no wheezing, no rhonchi Abdominal: Soft, NT, ND, bowel sounds + Extremities: no edema, no cyanosis   The results of significant diagnostics from this hospitalization (including imaging, microbiology, ancillary and laboratory) are listed below for reference.     Microbiology: Recent Results (from the past 240 hour(s))  MRSA PCR Screening     Status: None   Collection Time: 05/13/18  4:23 PM  Result Value Ref Range Status   MRSA by PCR NEGATIVE NEGATIVE Final    Comment:        The GeneXpert MRSA Assay (FDA approved for NASAL specimens only), is one component of a comprehensive MRSA colonization surveillance program. It  is not intended to diagnose MRSA infection nor to guide or monitor treatment for MRSA infections. Performed at [facility], [address] [place], [place] [address]      Labs: BNP (last 3 results) No results for input(s): BNP in the last 8760 hours. Basic Metabolic Panel: Recent Labs  Lab 05/13/18 1102  NA 138  K 3.5  CL 107  CO2 24  GLUCOSE 121*  BUN 9  CREATININE 0.69  CALCIUM 8.7*   Liver Function Tests: Recent Labs  Lab 05/13/18 1102  AST 119*  ALT 254*  ALKPHOS 64  BILITOT 1.0  PROT 8.1  ALBUMIN 3.8   No results for input(s): LIPASE, AMYLASE in the last 168 hours. No results for input(s): AMMONIA in the last 168 hours. CBC: Recent Labs  Lab 05/13/18 1102 05/14/18 0623  WBC 4.7 8.9  NEUTROABS 3.4  --   HGB 15.8 14.2  HCT 45.6 40.2  MCV 94.6 93.5  PLT 125* 136*   Cardiac Enzymes: Recent Labs  Lab 05/13/18 1102 05/13/18 1524 05/13/18 2052  TROPONINI <0.03 <0.03 <0.03   BNP: Invalid input(s): POCBNP CBG: No results for input(s): GLUCAP in the last 168 hours. D-Dimer No results for input(s): DDIMER in the last 72 hours. Hgb A1c No results for input(s): HGBA1C in the last 72 hours. Lipid Profile No results for input(s): CHOL, HDL, LDLCALC, TRIG, CHOLHDL, LDLDIRECT in the last 72 hours. Thyroid function studies No results for input(s): TSH, T4TOTAL, T3FREE, THYROIDAB in the last 72 hours.  Invalid input(s): FREET3 Anemia work up No results for input(s): VITAMINB12, FOLATE, FERRITIN, TIBC, IRON, RETICCTPCT in the last 72 hours. Urinalysis No results found for: COLORURINE, APPEARANCEUR, LABSPEC, PHURINE, GLUCOSEU, HGBUR, BILIRUBINUR, KETONESUR, PROTEINUR, UROBILINOGEN, NITRITE, LEUKOCYTESUR Sepsis Labs Invalid input(s): PROCALCITONIN,  WBC,  LACTICIDVEN Microbiology Recent Results (from the past 240 hour(s))  MRSA PCR Screening     Status: None   Collection Time: 05/13/18  4:23 PM  Result Value Ref Range Status   MRSA by PCR NEGATIVE  NEGATIVE Final    Comment:        The GeneXpert MRSA Assay (FDA approved for NASAL specimens only), is one component of a comprehensive MRSA colonization surveillance program. It is not intended to diagnose MRSA infection nor to guide or monitor treatment for MRSA infections. Performed at [facility], [address] [place], [place] [address]     Time coordinating discharge: 34 minutes  SIGNED:  Standley Dakins, MD  [facility] 05/15/2018, 9:51 AM Pager [phone]  If 7PM-7AM, please contact night-coverage [email or web address] Password [email or web address]

## 2019-12-09 IMAGING — DX DG CHEST 2V
2 series · 2 of 2 positions shown · non-contrast
Comparison: None.

CLINICAL DATA: Shortness of breath and chest pain over the last 2
weeks. Asthma.

EXAM:
CHEST - 2 VIEW

[chest pa]
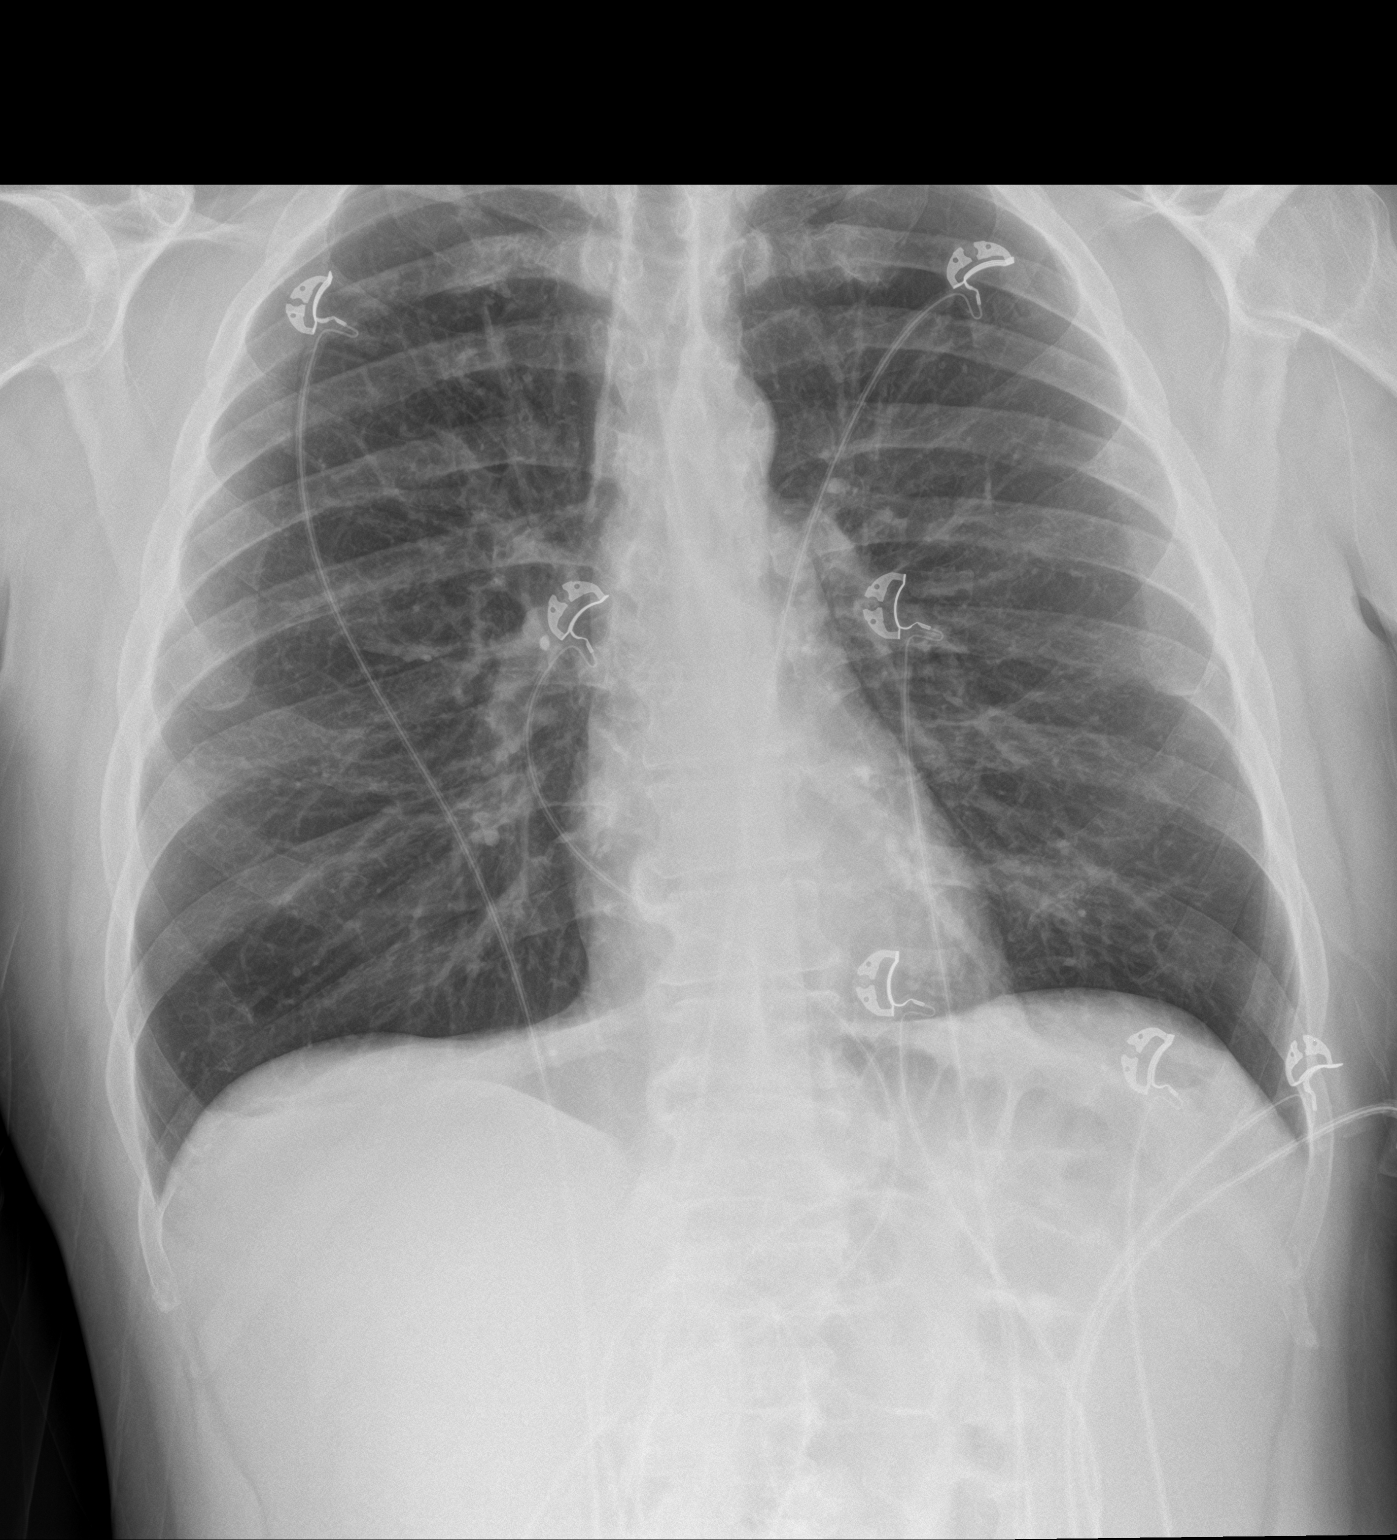

[chest lat]
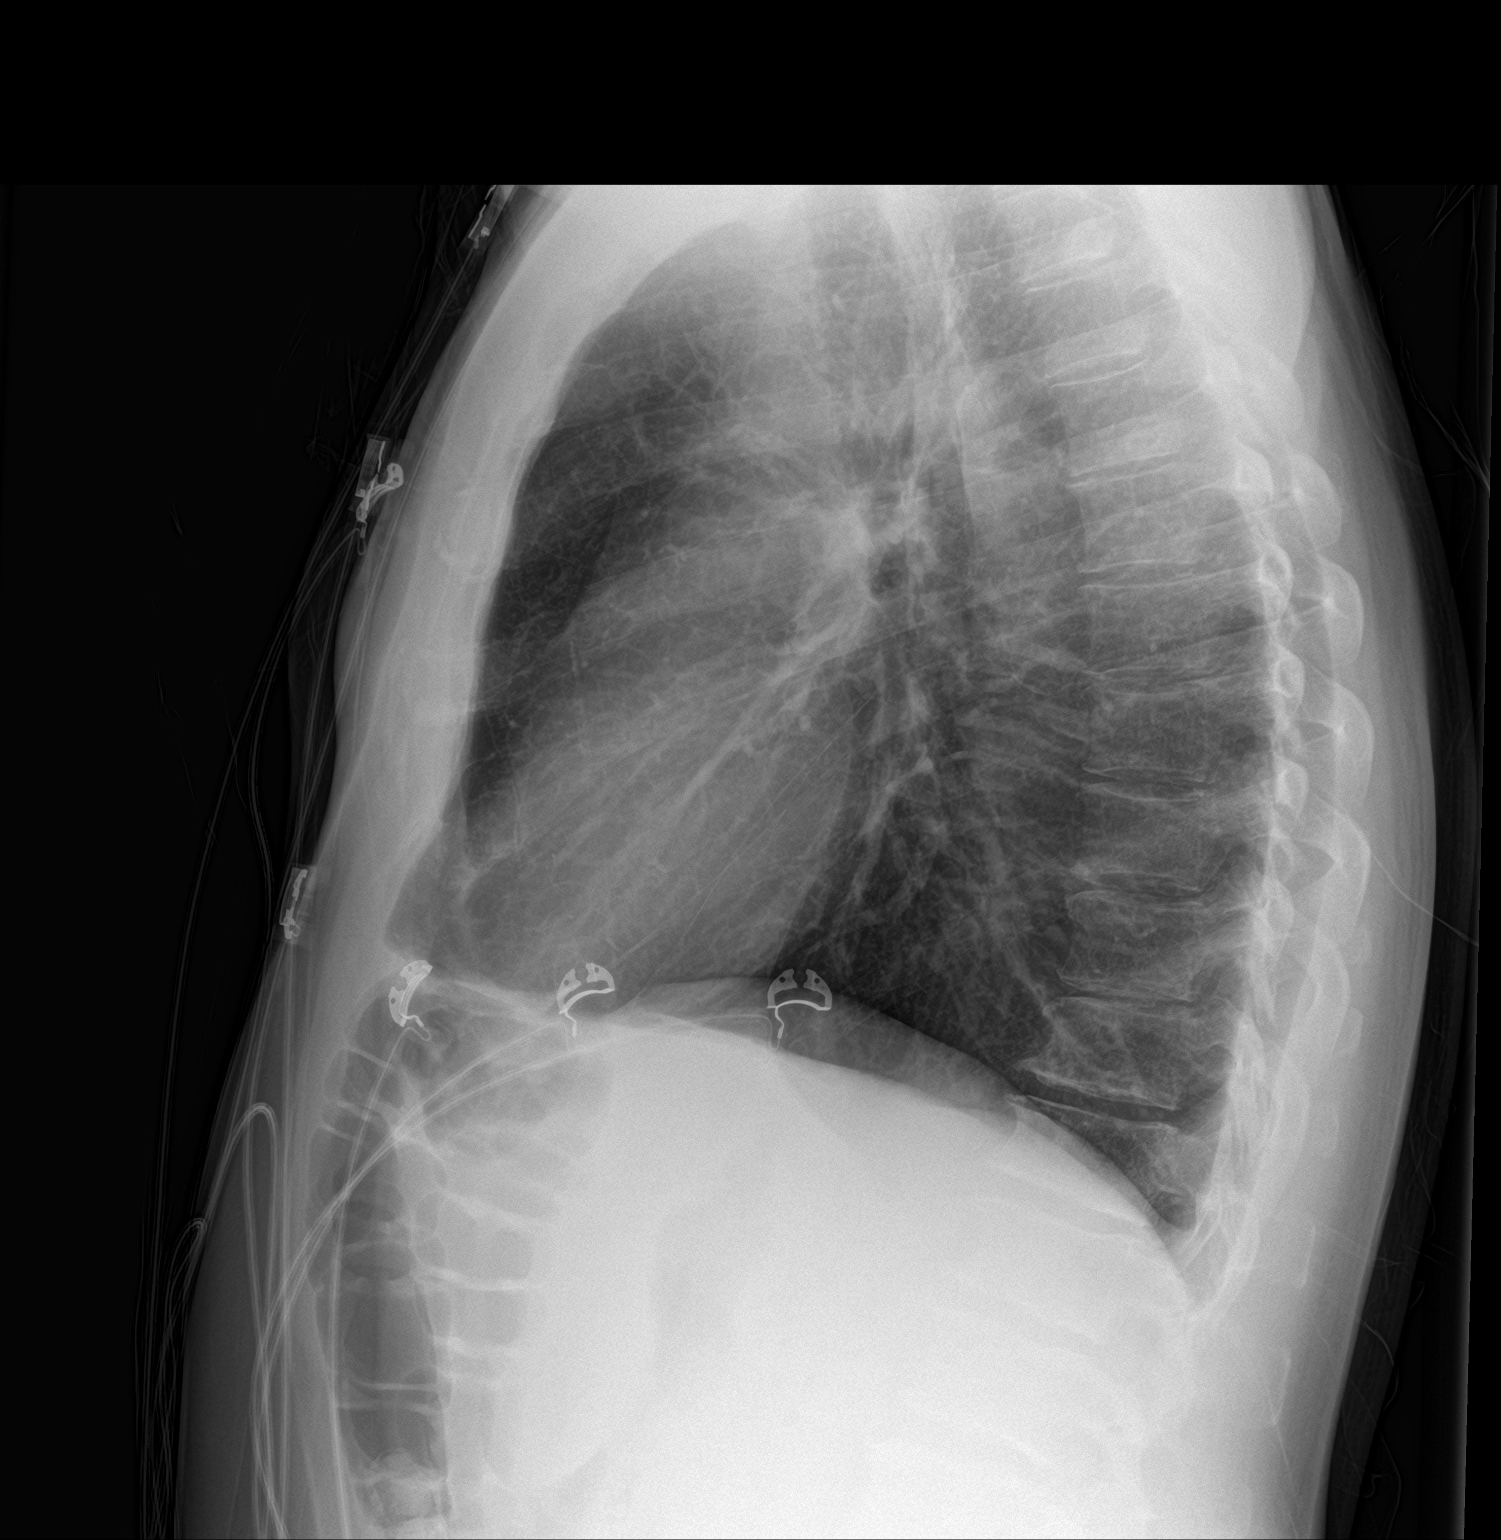

[2 of 2 positions shown; findings below may reference images not displayed]

FINDINGS: Artifact overlies the chest. Heart size is normal. Mediastinal
shadows are normal. Lung volumes are at the upper limits of normal.
There may be mild central bronchial thickening but there is no
infiltrate, collapse or effusion. No bone abnormality.
IMPRESSION: Question mild air trapping. Borderline central bronchial thickening.
No consolidation or collapse.

## 2019-12-10 IMAGING — DX DG CHEST 2V
2 series · 2 of 2 positions shown · non-contrast
Comparison: PA and lateral chest x-ray May 13, 2018

CLINICAL DATA: Shortness of breath and chest pain for the past 2
weeks associated with productive cough. History of COPD,
discontinued smoking 2 years ago.

EXAM:
CHEST - 2 VIEW

[chest pa]
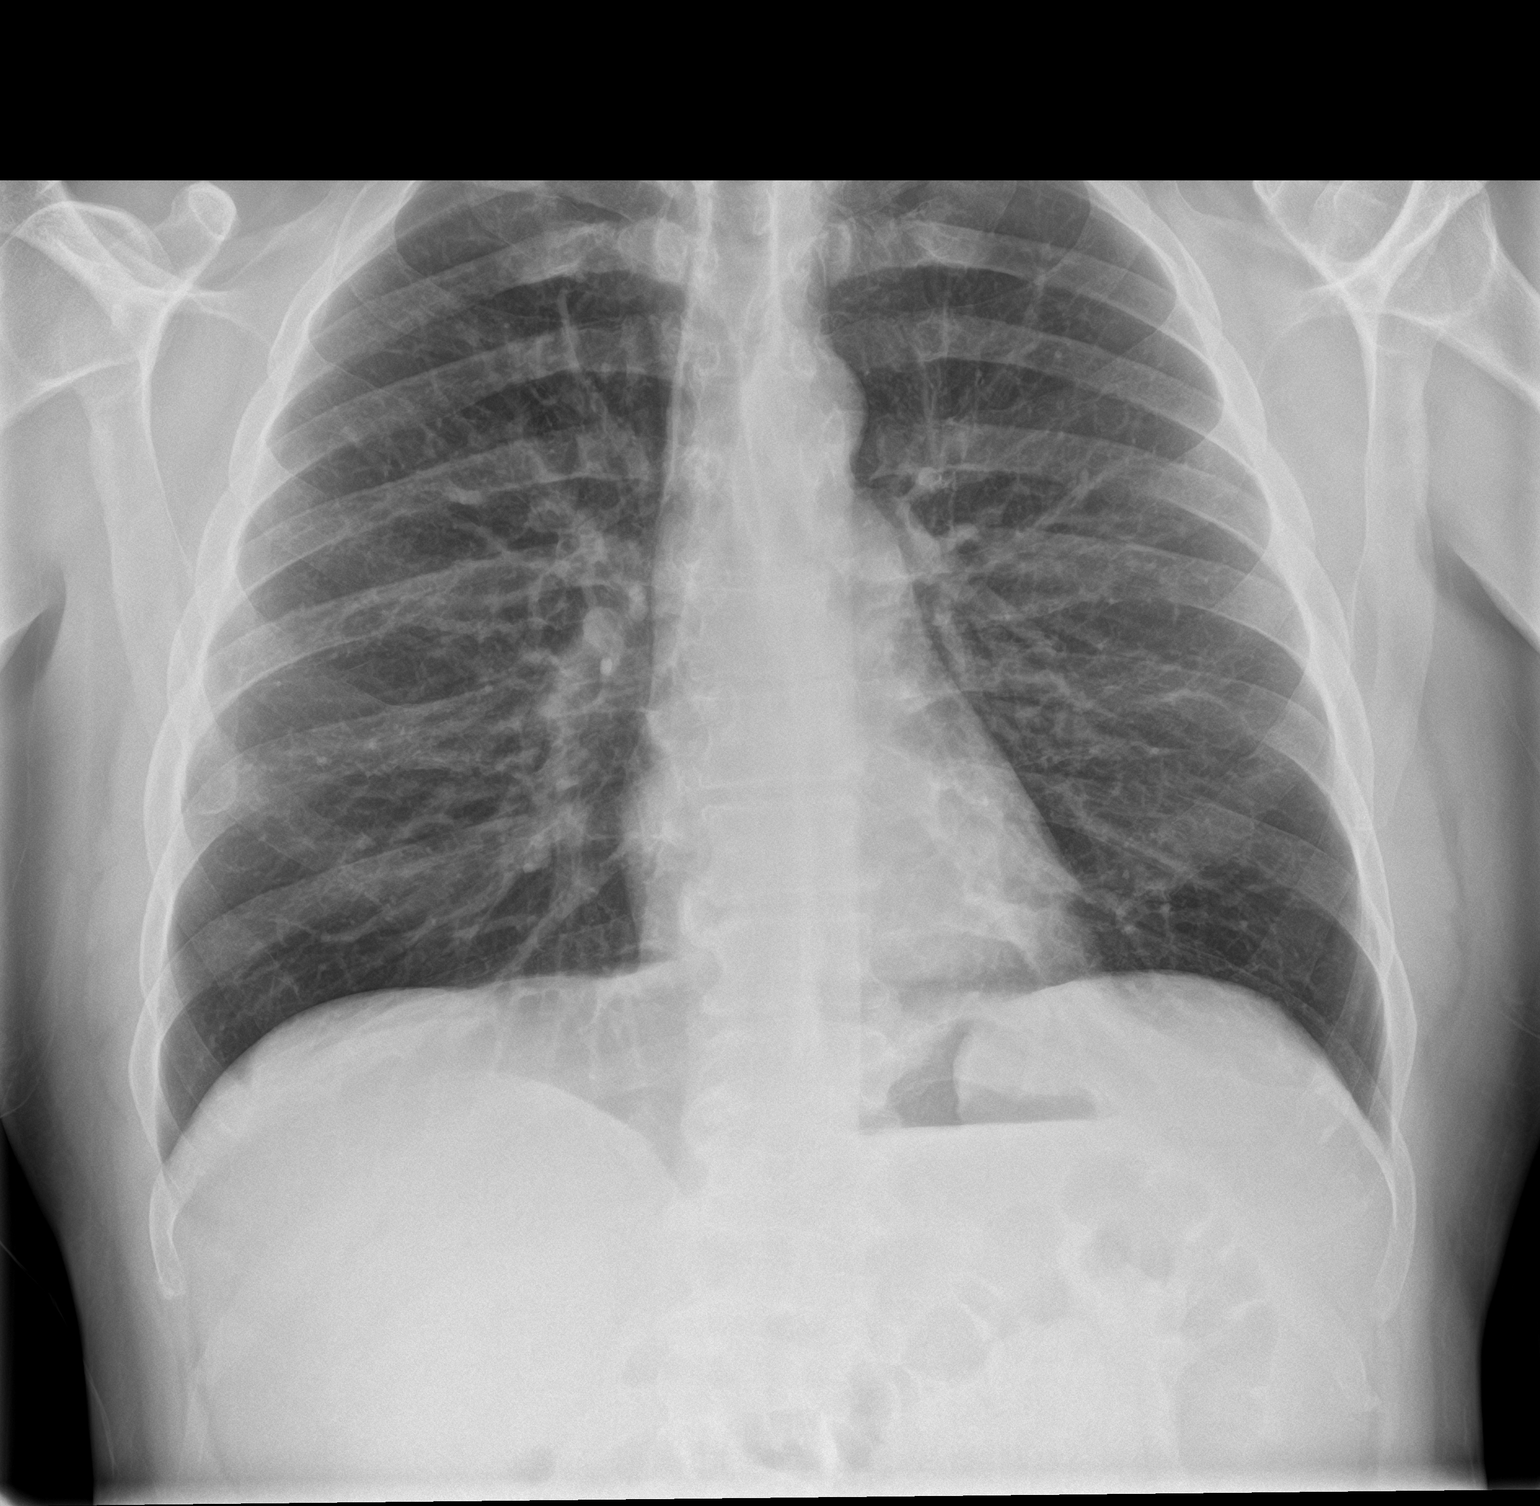

[chest lat]
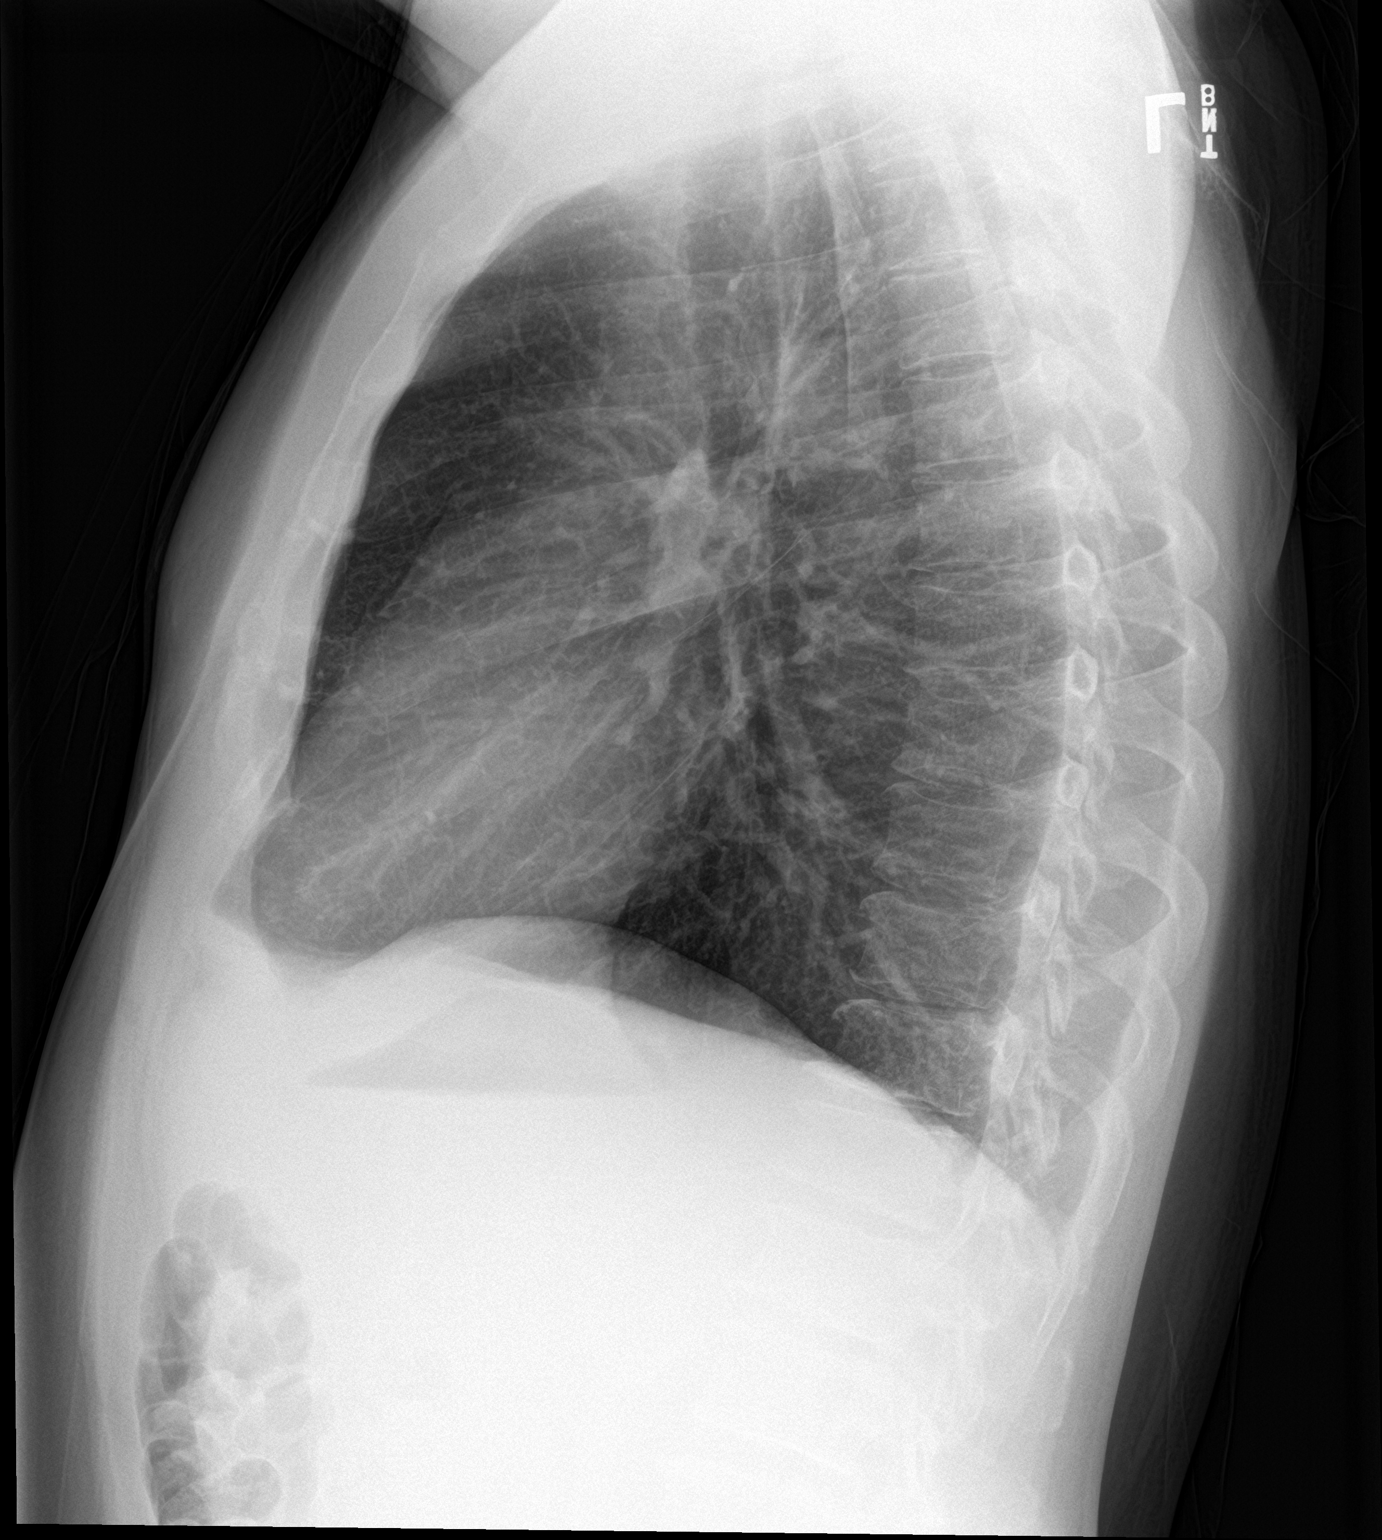

[2 of 2 positions shown; findings below may reference images not displayed]

FINDINGS: The lungs are well-expanded. There is no focal infiltrate. There is
no pleural effusion or pneumothorax. The heart is normal in size.
The perihilar lung markings are mildly increased as compared to the
previous study. No abnormal nodules are observed. The bony thorax
exhibits no acute abnormality.
IMPRESSION: Chronic bronchitic changes. I cannot exclude superimposed acute
bronchitis. No alveolar pneumonia nor CHF.

## 2020-07-31 DEATH — deceased
